# Patient Record
Sex: Male | Born: 1990
Health system: Southern US, Community
[De-identification: ages and names within clinical notes are randomized; demographics above are authoritative.]

## PROBLEM LIST (undated history)

## (undated) DIAGNOSIS — K219 Gastro-esophageal reflux disease without esophagitis: Secondary | ICD-10-CM

## (undated) DIAGNOSIS — F988 Other specified behavioral and emotional disorders with onset usually occurring in childhood and adolescence: Secondary | ICD-10-CM

## (undated) HISTORY — DX: Other specified behavioral and emotional disorders with onset usually occurring in childhood and adolescence: F98.8

## (undated) HISTORY — PX: TONSILLECTOMY AND ADENOIDECTOMY: SUR1326

---
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
## 2012-01-21 ENCOUNTER — Telehealth: Payer: Self-pay | Admitting: Internal Medicine

## 2012-01-22 NOTE — Telephone Encounter (Signed)
Written Rx for home nebulizer.  Mom will pick up.

## 2012-02-07 ENCOUNTER — Other Ambulatory Visit: Payer: 59 | Admitting: Internal Medicine

## 2012-02-12 ENCOUNTER — Encounter: Payer: 59 | Admitting: Internal Medicine

## 2012-03-21 ENCOUNTER — Ambulatory Visit (INDEPENDENT_AMBULATORY_CARE_PROVIDER_SITE_OTHER): Payer: 59 | Admitting: Internal Medicine

## 2012-03-21 ENCOUNTER — Encounter: Payer: Self-pay | Admitting: Internal Medicine

## 2012-03-21 VITALS — HR 88 | Temp 98.5°F | Wt 173.0 lb

## 2012-03-21 DIAGNOSIS — J9801 Acute bronchospasm: Secondary | ICD-10-CM

## 2012-03-21 DIAGNOSIS — J45909 Unspecified asthma, uncomplicated: Secondary | ICD-10-CM

## 2012-03-21 DIAGNOSIS — J069 Acute upper respiratory infection, unspecified: Secondary | ICD-10-CM

## 2012-03-21 DIAGNOSIS — J309 Allergic rhinitis, unspecified: Secondary | ICD-10-CM

## 2012-03-21 NOTE — Progress Notes (Signed)
  Subjective:    Patient ID: Casey Russell, male    DOB: Jan 26, 1991, 21 y.o.   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 Patient came down with URI couple of days ago. Has had some shortness of breath. Has home nebulizer for home use and he took a treatment at home before coming to the office. Pulse oximetry 98% on room air. Some cough. Some shortness of breath. Some sore throat. No fever or shaking chills   Review of Systems     Objective:   Physical Exam  HEENT exam: TMs are clear. Pharynx is slightly injected. Neck is supple without thyromegaly. Chest clear to auscultation without rales or significant wheezing. Pulse oximetry 98% on room air. This is after a nebulizer treatment at home. Skin is warm and dry.            Assessment & Plan:  URI acute  History of asthma  History of allergic rhinitis  Bronchospasm  Plan: Sterapred DS 10 mg 6 day dosepak. Biaxin 500 mg twice daily for 10 days. Hycodan 4 ounces 1 teaspoon by mouth Q8 hours when necessary cough

## 2012-03-21 NOTE — Patient Instructions (Addendum)
Take Biaxin 500 mg twice daily for 10 days. Take Sterapred DS 10 mg 6 day dosepak for 6 days. Take Hycodan sparingly for cough

## 2012-03-24 ENCOUNTER — Ambulatory Visit: Payer: 59 | Admitting: Internal Medicine

## 2012-03-25 ENCOUNTER — Encounter: Payer: 59 | Admitting: Internal Medicine

## 2012-04-25 ENCOUNTER — Other Ambulatory Visit: Payer: Self-pay | Admitting: Internal Medicine

## 2012-05-05 ENCOUNTER — Ambulatory Visit (INDEPENDENT_AMBULATORY_CARE_PROVIDER_SITE_OTHER): Payer: 59 | Admitting: Internal Medicine

## 2012-05-05 ENCOUNTER — Encounter: Payer: Self-pay | Admitting: Internal Medicine

## 2012-05-05 VITALS — BP 120/80 | HR 92 | Temp 97.8°F | Ht 68.25 in | Wt 172.0 lb

## 2012-05-05 DIAGNOSIS — Z0000 Encounter for general adult medical examination without abnormal findings: Secondary | ICD-10-CM

## 2012-05-05 LAB — POCT URINALYSIS DIPSTICK
Bilirubin, UA: NEGATIVE
Blood, UA: NEGATIVE
Glucose, UA: NEGATIVE
Leukocytes, UA: NEGATIVE
Nitrite, UA: NEGATIVE
Urobilinogen, UA: NEGATIVE

## 2012-05-06 LAB — CHOLESTEROL, TOTAL: Cholesterol: 179 mg/dL (ref 0–200)

## 2012-05-06 LAB — CBC WITH DIFFERENTIAL/PLATELET
Basophils Relative: 1 % (ref 0–1)
Eosinophils Absolute: 0.4 10*3/uL (ref 0.0–0.7)
Lymphs Abs: 2.2 10*3/uL (ref 0.7–4.0)
MCH: 32.9 pg (ref 26.0–34.0)
MCHC: 35.4 g/dL (ref 30.0–36.0)
Neutro Abs: 4.6 10*3/uL (ref 1.7–7.7)
Neutrophils Relative %: 59 % (ref 43–77)
Platelets: 247 10*3/uL (ref 150–400)
RBC: 4.95 MIL/uL (ref 4.22–5.81)

## 2012-06-01 ENCOUNTER — Encounter: Payer: Self-pay | Admitting: Internal Medicine

## 2012-06-01 NOTE — Progress Notes (Signed)
  Subjective:    Patient ID: Casey Russell, male    DOB: 1990/10/15, 22 y.o.   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 He has a history of asthma. History of allergy to wheat and peanuts. History of migraine headaches. In 2002 he had a fractured right wrist.  Received Tdap vaccine July 2010 and meningococcal vaccine July 2012. Had Pneumovax immunization 2002. History of tonsillectomy and adenoidectomy 1999.  He does not smoke.  Family history: Mother with history of asthma. History of diabetes mellitus in maternal great grandmother. Hypertension in maternal grandmother and maternal uncle. Stroke in maternal great-grandmother. Not much known about father's family history.  History of right shoulder grade 1 a.c. separation 2007 from football injury. History of back sprain from wrestling injury 2011.  He was a 6 lbs. 10 oz. product of vaginal delivery. Apgars were 9 and 9 respectively. He was a term infant. Received hepatitis B series. Mother had asthma attack during pregnancy and had to be hospitalized in May before his birth in October.  History of hives and urticaria.  History of attention deficit issues. Currently not on attention deficit medication.      Review of Systems  Constitutional: Negative.   HENT: Negative.   Eyes: Negative.   Respiratory: Negative.   Genitourinary: Negative.   Musculoskeletal: Negative.   Neurological: Negative.   Hematological: Negative.   Psychiatric/Behavioral: Negative.        Objective:   Physical Exam  Vitals reviewed. Constitutional: He is oriented to person, place, and time. He appears well-developed and well-nourished. No distress.  HENT:  Head: Normocephalic and atraumatic.  Right Ear: External ear normal.  Left Ear: External ear normal.  Mouth/Throat: Oropharynx is clear and moist. No oropharyngeal exudate.  Eyes: Conjunctivae normal  and EOM are normal. Pupils are equal, round, and reactive to light. Right eye exhibits no discharge. Left eye exhibits no discharge. No scleral icterus.  Neck: Normal range of motion. Neck supple. No JVD present. No thyromegaly present.  Cardiovascular: Normal rate, normal heart sounds and intact distal pulses.   No murmur heard. Pulmonary/Chest: Effort normal and breath sounds normal. He has no wheezes. He has no rales.  Abdominal: Soft. Bowel sounds are normal. He exhibits no distension and no mass. There is no tenderness. There is no rebound and no guarding.  Genitourinary: Penis normal.       No hernias to direct palpation  Musculoskeletal: Normal range of motion. He exhibits no edema.  Lymphadenopathy:    He has no cervical adenopathy.  Neurological: He is alert and oriented to person, place, and time. He displays normal reflexes. No cranial nerve deficit. Coordination normal.  Skin: Skin is warm and dry. No rash noted. He is not diaphoretic.  Psychiatric: He has a normal mood and affect. His behavior is normal. Judgment and thought content normal.          Assessment & Plan:  History of asthma-stable has albuterol on hand  History of hives and urticaria  History of allergic rhinitis  History of migraine headaches-related to food. Has responded to watching headache producing foods  History of attention deficit disorder-currently not on medication  Plan: Return in 1-2 years or when necessary

## 2012-06-01 NOTE — Patient Instructions (Addendum)
Use inhaler if needed for asthma. Return in 1-2 years or as needed

## 2013-04-02 ENCOUNTER — Ambulatory Visit (INDEPENDENT_AMBULATORY_CARE_PROVIDER_SITE_OTHER): Payer: 59 | Admitting: Internal Medicine

## 2013-04-02 ENCOUNTER — Encounter: Payer: Self-pay | Admitting: Internal Medicine

## 2013-04-02 VITALS — BP 118/76 | HR 68 | Temp 97.2°F | Ht 68.0 in | Wt 175.0 lb

## 2013-04-02 DIAGNOSIS — I889 Nonspecific lymphadenitis, unspecified: Secondary | ICD-10-CM

## 2013-04-02 DIAGNOSIS — Z23 Encounter for immunization: Secondary | ICD-10-CM

## 2013-05-29 ENCOUNTER — Other Ambulatory Visit: Payer: Self-pay | Admitting: Internal Medicine

## 2013-09-13 ENCOUNTER — Encounter: Payer: Self-pay | Admitting: Internal Medicine

## 2013-09-13 NOTE — Patient Instructions (Signed)
Take Augmentin 500 mg 3 times daily for 10 days. 

## 2013-09-13 NOTE — Progress Notes (Signed)
   Subjective:    Patient ID: Casey Russell, male    DOB: 09/11/1990, 23 y.o.   MRN: [id number]  HPI Complaining of pain in left upper anterior neck area. No significant sore throat. Has had some respiratory congestion recently. Worried about neck pain. He is a Ship broker at [employer].    Review of Systems     Objective:   Physical Exam  Neck is supple. Tender left anterior cervical node. Pharynx only slightly injected. TMs are clear. Chest clear.      Assessment & Plan:  Left cervical adenitis  Plan: Augmentin 500 mg 3 times daily for 10 days

## 2014-08-20 ENCOUNTER — Other Ambulatory Visit: Payer: Self-pay | Admitting: Internal Medicine

## 2015-07-12 ENCOUNTER — Encounter: Payer: Self-pay | Admitting: Internal Medicine

## 2015-07-12 ENCOUNTER — Ambulatory Visit (INDEPENDENT_AMBULATORY_CARE_PROVIDER_SITE_OTHER): Payer: 59 | Admitting: Internal Medicine

## 2015-07-12 VITALS — BP 136/82 | HR 103 | Temp 100.1°F | Ht 67.0 in | Wt 180.0 lb

## 2015-07-12 DIAGNOSIS — J111 Influenza due to unidentified influenza virus with other respiratory manifestations: Secondary | ICD-10-CM

## 2015-07-12 MED ORDER — AMOXICILLIN 500 MG PO CAPS: 500.0000 mg | ORAL_CAPSULE | Freq: Three times a day (TID) | ORAL | Status: DC

## 2015-07-12 MED ORDER — PROMETHAZINE HCL 25 MG PO TABS: 25.0000 mg | ORAL_TABLET | Freq: Three times a day (TID) | ORAL | Status: DC | PRN

## 2015-07-12 MED ORDER — OSELTAMIVIR PHOSPHATE 75 MG PO CAPS: 75.0000 mg | ORAL_CAPSULE | Freq: Two times a day (BID) | ORAL | Status: DC

## 2015-07-12 MED FILL — OSELTAMIVIR PHOS 75 MG CAP: 75 | 5 days supply | Qty: 10 | Fill #0

## 2015-07-12 MED FILL — PROMETHAZINE 25 MG TABLET: 25 | 7 days supply | Qty: 20 | Fill #0

## 2015-07-12 MED FILL — AMOXICILLIN 500 MG CAPSULE: 500 | 10 days supply | Qty: 30 | Fill #0

## 2015-07-12 NOTE — Progress Notes (Signed)
   Subjective:    Patient ID: Casey Russell, male    DOB: Mar 15, 1991, 25 y.o.   MRN: [id number]  HPI Onset Sunday night of fever, myalgias, headache, nausea and respiratory congestion. History of asthma. No wheezing or respiratory distress. Did not take flu vaccine. Has been around people at work that were ill. 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 class this morning because of illness. Unable to eat due to nausea.    Review of Systems     Objective:   Physical Exam  Skin warm and dry. Nodes none. Pharynx is clear. TMs are clear. Neck is supple. Chest clear to auscultation without rales or wheezing.      Assessment & Plan:  Influenza  Plan: Tamiflu 75 mg twice daily for 5 days. Phenergan 25 mg #20 one by mouth every 8 hours when necessary nausea. Aleve 2 tablets twice daily for headache and fever. Amoxicillin 500 mg 3 times daily x 10 days for respiratory infection. Note to be out of school until afebrile for 24 hours.

## 2015-07-12 NOTE — Patient Instructions (Signed)
Take Tamiflu 75 mg twice daily for 5 days. Out of school until afebrile for 24 hours. Same instructions for work. Phenergan 25 mg tablets 1 by mouth every 8 hours when necessary nausea. Amoxicillin 500 mg 3 times daily for 10 days. Aleve 2 tablets twice daily for fever and myalgias.

## 2015-09-07 ENCOUNTER — Other Ambulatory Visit: Payer: Self-pay

## 2015-09-07 MED ORDER — ALBUTEROL SULFATE HFA 108 (90 BASE) MCG/ACT IN AERS: 2.0000 | INHALATION_SPRAY | Freq: Four times a day (QID) | RESPIRATORY_TRACT | Status: DC

## 2015-09-07 MED FILL — VENTOLIN HFA 90 MCG INHALER: 108 (90 BAS | 16 days supply | Qty: 18 | Fill #0

## 2015-09-07 NOTE — Telephone Encounter (Signed)
Refill request received from pharmacy per patient request

## 2016-05-22 ENCOUNTER — Encounter: Payer: 59 | Admitting: Internal Medicine

## 2016-05-22 ENCOUNTER — Telehealth: Payer: Self-pay | Admitting: Internal Medicine

## 2016-05-22 ENCOUNTER — Encounter: Payer: Self-pay | Admitting: Internal Medicine

## 2016-05-22 NOTE — Telephone Encounter (Signed)
Called pt cell phone and left message re: missed  CPE appt today. Asked him to reschedule.

## 2016-07-16 ENCOUNTER — Ambulatory Visit (INDEPENDENT_AMBULATORY_CARE_PROVIDER_SITE_OTHER): Payer: 59 | Admitting: Internal Medicine

## 2016-07-16 ENCOUNTER — Encounter: Payer: Self-pay | Admitting: Internal Medicine

## 2016-07-16 ENCOUNTER — Other Ambulatory Visit: Payer: Self-pay | Admitting: Internal Medicine

## 2016-07-16 VITALS — BP 140/90 | HR 84 | Temp 97.0°F | Ht 67.5 in | Wt 167.0 lb

## 2016-07-16 DIAGNOSIS — R030 Elevated blood-pressure reading, without diagnosis of hypertension: Secondary | ICD-10-CM | POA: Diagnosis not present

## 2016-07-16 DIAGNOSIS — Z113 Encounter for screening for infections with a predominantly sexual mode of transmission: Secondary | ICD-10-CM | POA: Diagnosis not present

## 2016-07-16 DIAGNOSIS — R8290 Unspecified abnormal findings in urine: Secondary | ICD-10-CM | POA: Diagnosis not present

## 2016-07-16 DIAGNOSIS — Z0000 Encounter for general adult medical examination without abnormal findings: Secondary | ICD-10-CM | POA: Diagnosis not present

## 2016-07-16 DIAGNOSIS — R319 Hematuria, unspecified: Secondary | ICD-10-CM | POA: Diagnosis not present

## 2016-07-16 DIAGNOSIS — Z13220 Encounter for screening for lipoid disorders: Secondary | ICD-10-CM | POA: Diagnosis not present

## 2016-07-16 LAB — LIPID PANEL
CHOL/HDL RATIO: 2.9 ratio (ref <5.0)
Cholesterol: 194 mg/dL (ref <200)
HDL: 68 mg/dL (ref >40)
LDL CALC: 107 mg/dL — AB (ref <100)
Triglycerides: 96 mg/dL (ref <150)
VLDL: 19 mg/dL (ref <30)

## 2016-07-16 LAB — COMPREHENSIVE METABOLIC PANEL
ALT: 17 U/L (ref 9–46)
AST: 18 U/L (ref 10–40)
Albumin: 4.3 g/dL (ref 3.6–5.1)
Alkaline Phosphatase: 72 U/L (ref 40–115)
BUN: 11 mg/dL (ref 7–25)
CO2: 27 mmol/L (ref 20–31)
Calcium: 9.6 mg/dL (ref 8.6–10.3)
Chloride: 105 mmol/L (ref 98–110)
Creat: 1.02 mg/dL (ref 0.60–1.35)
GLUCOSE: 78 mg/dL (ref 65–99)
POTASSIUM: 3.8 mmol/L (ref 3.5–5.3)
Sodium: 140 mmol/L (ref 135–146)
Total Bilirubin: 0.8 mg/dL (ref 0.2–1.2)
Total Protein: 6.7 g/dL (ref 6.1–8.1)

## 2016-07-16 LAB — POCT URINALYSIS DIPSTICK
Bilirubin, UA: NEGATIVE
Glucose, UA: NEGATIVE
Ketones, UA: NEGATIVE
Leukocytes, UA: NEGATIVE
Nitrite, UA: NEGATIVE
PH UA: 6 (ref 5.0–8.0)
SPEC GRAV UA: 1.015 (ref 1.030–1.035)
UROBILINOGEN UA: 0.2 (ref ?–2.0)

## 2016-07-16 LAB — CBC WITH DIFFERENTIAL/PLATELET
BASOS ABS: 81 {cells}/uL (ref 0–200)
BASOS PCT: 1 %
EOS ABS: 405 {cells}/uL (ref 15–500)
Eosinophils Relative: 5 %
HCT: 47.3 % (ref 38.5–50.0)
Hemoglobin: 16.3 g/dL (ref 13.2–17.1)
LYMPHS PCT: 29 %
Lymphs Abs: 2349 cells/uL (ref 850–3900)
MCH: 33.1 pg — AB (ref 27.0–33.0)
MCHC: 34.5 g/dL (ref 32.0–36.0)
MCV: 96.1 fL (ref 80.0–100.0)
MONOS PCT: 6 %
MPV: 9.6 fL (ref 7.5–12.5)
Monocytes Absolute: 486 cells/uL (ref 200–950)
Neutro Abs: 4779 cells/uL (ref 1500–7800)
Neutrophils Relative %: 59 %
PLATELETS: 209 10*3/uL (ref 140–400)
RBC: 4.92 MIL/uL (ref 4.20–5.80)
RDW: 13.3 % (ref 11.0–15.0)
WBC: 8.1 10*3/uL (ref 3.8–10.8)

## 2016-07-16 NOTE — Addendum Note (Signed)
Addended by: Inocencio Homes on: 07/16/2016 12:12 PM   Modules accepted: Orders

## 2016-07-16 NOTE — Progress Notes (Addendum)
Subjective:    Patient ID: [name], male    DOB: 1990-12-19, 26 y.o.   MRN: [id number]  HPI  26 year old Male for health maintenance exam and evaluation of medical issues. 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 He says he missed class this morning. Says he overslept. His blood pressure for some reason is elevated at 140 overnight knee. It was rechecked and was once again 140/90. Says he's been eating a lot of fast food. Told him he needs to cut back on fast food and eat more healthy foods. He works out some. He is in good physical shape. He is not overweight.  Has no complaints. Feels well in general.  He has a remote history of asthma but has not had any issues with asthma in several years.  In 2013 he was seen by Dr. Scarlette Shorts for an episode of acute colitis. This was noted on CT scan. Was treated with Cipro and Flagyl. Symptoms improved. He also had an episode of erosive esophagitis January 2013 noted on endoscopy by Dr. Henrene Pastor. 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 He had esophageal dilatation. Was treated with omeprazole.  Tetanus immunization is up-to-date. He had meningococcal vaccine July 2012.  History of tonsillectomy and adenoidectomy 1999.  History of allergy to wheat and peanuts.  In 2002 he had a fractured right wrist.  He does not smoke.  Family history: Mother with history of asthma. History of diabetes mellitus and maternal great-grandmother. Hypertension in maternal grandmother and maternal uncle. Stroke in maternal great-grandmother. Not much known about father's family history.  Treated for influenza March 2017.  Did not take flu vaccine this season.  History of right shoulder grade 1 AC joint separation 2007 from football injury. History of back sprain from wrestling injury 2011  He was a 6 lbs. 10 oz. product of a vaginal delivery. Apgars were 9 and 9 respectively. He was  a term infant. Received the hepatitis B series. Mother had asthma attack during pregnancy and had to be hospitalized in May before his birth in October.  History of attention deficit issues. Currently not on attention deficit medication.  History of migraine headaches.  Review of Systems  Constitutional: Negative.   All other systems reviewed and are negative.      Objective:   Physical Exam  Constitutional: He is oriented to person, place, and time. He appears well-developed and well-nourished. No distress.  HENT:  Head: Normocephalic and atraumatic.  Right Ear: External ear normal.  Left Ear: External ear normal.  Mouth/Throat: Oropharynx is clear and moist. No oropharyngeal exudate.  Eyes: Conjunctivae and EOM are normal. Pupils are equal, round, and reactive to light. Right eye exhibits no discharge. Left eye exhibits no discharge. No scleral icterus.  Neck: Neck supple. No JVD present. No thyromegaly present.  Cardiovascular: Normal rate, regular rhythm, normal heart sounds and intact distal pulses.   No murmur heard. Pulmonary/Chest: Effort normal and breath sounds normal. No respiratory distress. He has no wheezes. He has no rales.  Abdominal: Soft. Bowel sounds are normal. He exhibits no distension and no mass. There is no tenderness. There is no rebound and no guarding.  Genitourinary:  Genitourinary Comments: No hernias to direct palpation  Musculoskeletal: He exhibits no edema.  Lymphadenopathy:    He has no cervical adenopathy.  Neurological: He is alert and oriented to person, place, and time. He has normal reflexes. No cranial  nerve deficit. Coordination normal.  Skin: Skin is warm and dry. No rash noted. He is not diaphoretic.  Psychiatric: He has a normal mood and affect. His behavior is normal. Judgment and thought content normal.  Vitals reviewed.         Assessment & Plan:  Normal health maintenance exam  Elevated blood pressure reading  Plan: Cut back  on fast food and return in May after school is out for blood pressure check and office visit. STD screening done today at his request. Fasting labs drawn and are pending. Immunizations are up-to-date.

## 2016-07-16 NOTE — Patient Instructions (Addendum)
Return in May for blood pressure check. Watch fast food. Labs pending.

## 2016-07-17 ENCOUNTER — Ambulatory Visit (INDEPENDENT_AMBULATORY_CARE_PROVIDER_SITE_OTHER): Payer: 59 | Admitting: Internal Medicine

## 2016-07-17 ENCOUNTER — Encounter: Payer: Self-pay | Admitting: Internal Medicine

## 2016-07-17 VITALS — BP 168/100 | HR 66 | Temp 97.4°F | Ht 67.5 in | Wt 165.0 lb

## 2016-07-17 DIAGNOSIS — A749 Chlamydial infection, unspecified: Secondary | ICD-10-CM | POA: Diagnosis not present

## 2016-07-17 LAB — URINALYSIS, MICROSCOPIC ONLY
Bacteria, UA: NONE SEEN [HPF]
Casts: NONE SEEN [LPF]
Crystals: NONE SEEN [HPF]
Yeast: NONE SEEN [HPF]

## 2016-07-17 LAB — GC/CHLAMYDIA PROBE AMP
CT PROBE, AMP APTIMA: DETECTED — AB
GC PROBE AMP APTIMA: NOT DETECTED

## 2016-07-17 LAB — HIV ANTIBODY (ROUTINE TESTING W REFLEX): HIV: NONREACTIVE

## 2016-07-17 MED ORDER — DOXYCYCLINE HYCLATE 100 MG PO TABS: 100.0000 mg | ORAL_TABLET | Freq: Two times a day (BID) | ORAL | 0 refills | Status: DC

## 2016-07-17 MED FILL — DOXYCYCLINE HYCLATE 100 MG: 100 | 10 days supply | Qty: 20 | Fill #0

## 2016-07-17 NOTE — Patient Instructions (Signed)
Doxycycline 100 mg twice daily for 10 days. No intercourse until rechecked in 2 weeks. Patient to notify partner.

## 2016-07-17 NOTE — Progress Notes (Signed)
   Subjective:    Patient ID: Casey Russell, male    DOB: [birth year], 26 y.o.   MRN: [id number]  HPI 26 year old 26 male here yesterday for physical exam. He wanted to be screened for STDs. HIV is negative but he had a positive chlamydia probe by urine testing. He said he felt something was wrong because he had some discomfort in his genital area. He has not noted any penile discharge. His dipstick UA was abnormal it showed moderate blood and trace protein. It was sent for microscopic and he had greater than 60 white blood cells per high-powered field and 3-10 red blood cells per high-powered field. Urine culture is pending.  I reviewed other lab results with him today. LDL cholesterol is 107.  His blood pressures elevated once again today but he is anxious about these results.  He says he was anxious yesterday about coming to the doctor.     Review of Systems see above     Objective:   Physical Exam  Not examined periods but 15 minutes speaking with him about chlamydia and course of treatment and follow-up treatment. Needs to notify partner of findings.      Assessment & Plan:  Chlamydia  Plan: Doxycycline 100 mg twice daily for 10 days. No intercourse until rechecked in 2 weeks. He is also scheduled for follow-up blood pressure check at that time as well as in May. Seems to have office hypertension due to anxiety.

## 2016-07-18 LAB — URINE CULTURE: Organism ID, Bacteria: NO GROWTH

## 2016-07-31 ENCOUNTER — Ambulatory Visit: Payer: 59 | Admitting: Internal Medicine

## 2016-08-01 ENCOUNTER — Ambulatory Visit: Payer: 59 | Admitting: Internal Medicine

## 2016-08-02 ENCOUNTER — Ambulatory Visit (INDEPENDENT_AMBULATORY_CARE_PROVIDER_SITE_OTHER): Payer: 59 | Admitting: Internal Medicine

## 2016-08-02 VITALS — BP 110/88 | HR 82 | Temp 97.4°F | Wt 169.0 lb

## 2016-08-02 DIAGNOSIS — A749 Chlamydial infection, unspecified: Secondary | ICD-10-CM | POA: Diagnosis not present

## 2016-08-02 DIAGNOSIS — R8290 Unspecified abnormal findings in urine: Secondary | ICD-10-CM | POA: Diagnosis not present

## 2016-08-02 DIAGNOSIS — Z8619 Personal history of other infectious and parasitic diseases: Secondary | ICD-10-CM

## 2016-08-02 DIAGNOSIS — R030 Elevated blood-pressure reading, without diagnosis of hypertension: Secondary | ICD-10-CM

## 2016-08-02 LAB — POCT URINALYSIS DIPSTICK
Bilirubin, UA: NEGATIVE
Glucose, UA: NEGATIVE
KETONES UA: NEGATIVE
Leukocytes, UA: NEGATIVE
Nitrite, UA: NEGATIVE
PROTEIN UA: NEGATIVE
SPEC GRAV UA: 1.015 (ref 1.030–1.035)
Urobilinogen, UA: NEGATIVE (ref ?–2.0)
pH, UA: 6 (ref 5.0–8.0)

## 2016-08-03 LAB — GC/CHLAMYDIA PROBE AMP
CT PROBE, AMP APTIMA: NOT DETECTED
GC PROBE AMP APTIMA: NOT DETECTED

## 2016-08-25 ENCOUNTER — Encounter: Payer: Self-pay | Admitting: Internal Medicine

## 2016-08-25 NOTE — Patient Instructions (Signed)
Chlamydia test of cure is negative. Continue to monitor blood pressure. Watch salt and fat in diet. Try to get regular exercise.

## 2016-08-25 NOTE — Progress Notes (Signed)
   Subjective:    Patient ID: Casey Russell, male    DOB: [birth year], 26 y.o.   MRN: [id number]  HPI he was here March 19 for physical exam. He was screened for STDs and was found to have a positive chlamydia probe by urine test. He had felt some discomfort in his general tall area. He did have an abnormal urine dipstick at the time. He did take doxycycline as directed and he's now here for test of cure and follow-up on elevated blood pressure. When he was here on March 20 blood pressure was 168/100 but he was anxious. Blood pressure was 140/90 on the day of his physical exam. Blood pressure today is 110/88.    Review of Systems see above     Objective:   Physical Exam  He is not overweight. Is much Colmer than he was at visit of March 20. His dipstick urine is now normal status post treatment with doxycycline and his chlamydia test of cure is negative.     Assessment & Plan:  Elevated blood pressure-needs to be monitored  Plan: Watch diet. Watch salt intake. Advised low-fat diet and exercise. He plans to go to summer school this summer. 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 Spoke with him about sexually transmitted diseases.

## 2016-10-29 ENCOUNTER — Encounter (HOSPITAL_COMMUNITY): Payer: Self-pay | Admitting: Family Medicine

## 2016-10-29 ENCOUNTER — Ambulatory Visit (HOSPITAL_COMMUNITY)
Admission: EM | Admit: 2016-10-29 | Discharge: 2016-10-29 | Disposition: A | Discharge status: Home / Self Care | Payer: 59 | Attending: Emergency Medicine | Admitting: Emergency Medicine

## 2016-10-29 ENCOUNTER — Ambulatory Visit (INDEPENDENT_AMBULATORY_CARE_PROVIDER_SITE_OTHER): Payer: 59

## 2016-10-29 DIAGNOSIS — Z23 Encounter for immunization: Secondary | ICD-10-CM

## 2016-10-29 DIAGNOSIS — S61052A Open bite of left thumb without damage to nail, initial encounter: Secondary | ICD-10-CM | POA: Diagnosis not present

## 2016-10-29 DIAGNOSIS — W503XXA Accidental bite by another person, initial encounter: Secondary | ICD-10-CM

## 2016-10-29 DIAGNOSIS — S61259A Open bite of unspecified finger without damage to nail, initial encounter: Secondary | ICD-10-CM

## 2016-10-29 DIAGNOSIS — M7989 Other specified soft tissue disorders: Secondary | ICD-10-CM | POA: Diagnosis not present

## 2016-10-29 DIAGNOSIS — S61251A Open bite of left index finger without damage to nail, initial encounter: Secondary | ICD-10-CM

## 2016-10-29 MED ORDER — MUPIROCIN CALCIUM 2 % EX CREA: 1.0000 "application " | TOPICAL_CREAM | Freq: Two times a day (BID) | CUTANEOUS | 0 refills | Status: AC

## 2016-10-29 MED ORDER — TETANUS-DIPHTH-ACELL PERTUSSIS 5-2.5-18.5 LF-MCG/0.5 IM SUSP
INTRAMUSCULAR | Status: AC
  Filled 2016-10-29: qty 0.5

## 2016-10-29 MED ORDER — AMOXICILLIN-POT CLAVULANATE 875-125 MG PO TABS: 1.0000 | ORAL_TABLET | Freq: Two times a day (BID) | ORAL | 0 refills | Status: AC

## 2016-10-29 MED ORDER — TETANUS-DIPHTH-ACELL PERTUSSIS 5-2.5-18.5 LF-MCG/0.5 IM SUSP
0.5000 mL | Freq: Once | INTRAMUSCULAR | Status: AC
  Administered 2016-10-29: 0.5 mL via INTRAMUSCULAR

## 2016-10-29 MED ORDER — BACITRACIN-NEOMYCIN-POLYMYXIN 400-5-5000 EX OINT
TOPICAL_OINTMENT | Freq: Once | CUTANEOUS | Status: AC
  Administered 2016-10-29: 13:00:00 via TOPICAL

## 2016-10-29 MED FILL — MUPIROCIN 2% CREAM: 2 | 10 days supply | Qty: 30 | Fill #0

## 2016-10-29 MED FILL — AMOX-CLAV 875-125 MG TABLET: 875-125 | 7 days supply | Qty: 14 | Fill #0

## 2016-10-29 NOTE — ED Notes (Signed)
Pharmacy called and stated that the  Bactroban needed to be changed to ointment instead of cream. J. Defelice NP gave a verbal order to change.

## 2016-10-29 NOTE — ED Triage Notes (Signed)
Pt here for bite wound and swelling to left index finger. sts he was bit on Saturday. Unable to bend finger at the joint.

## 2016-10-29 NOTE — Discharge Instructions (Signed)
Rest,elevate, left hand daily dressing changes to left hand(wound). Take antibiotic as directed. Your hand xray was negative for gas, fracture or dislocation. Follow up with PCP in 2 days for wound check. Go to ER if new or worsening s/s(fever, unable to keep meds down, worsening pain, decreased sensation/movement of hand, streaking, etc).
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
## 2016-12-08 DIAGNOSIS — Y92410 Unspecified street and highway as the place of occurrence of the external cause: Secondary | ICD-10-CM | POA: Diagnosis not present

## 2016-12-08 DIAGNOSIS — S1980XA Other specified injuries of unspecified part of neck, initial encounter: Secondary | ICD-10-CM | POA: Diagnosis not present

## 2016-12-08 DIAGNOSIS — S0990XA Unspecified injury of head, initial encounter: Secondary | ICD-10-CM | POA: Diagnosis not present

## 2016-12-08 DIAGNOSIS — S298XXA Other specified injuries of thorax, initial encounter: Secondary | ICD-10-CM | POA: Diagnosis not present

## 2016-12-08 DIAGNOSIS — S3991XA Unspecified injury of abdomen, initial encounter: Secondary | ICD-10-CM | POA: Diagnosis not present

## 2016-12-10 ENCOUNTER — Encounter: Payer: Self-pay | Admitting: Internal Medicine

## 2016-12-10 ENCOUNTER — Telehealth: Payer: Self-pay | Admitting: Internal Medicine

## 2016-12-10 ENCOUNTER — Ambulatory Visit
Admission: RE | Admit: 2016-12-10 | Discharge: 2016-12-10 | Disposition: A | Discharge status: Home / Self Care | Payer: 59 | Source: Ambulatory Visit | Attending: Internal Medicine | Admitting: Internal Medicine

## 2016-12-10 ENCOUNTER — Ambulatory Visit (INDEPENDENT_AMBULATORY_CARE_PROVIDER_SITE_OTHER): Payer: 59 | Admitting: Internal Medicine

## 2016-12-10 DIAGNOSIS — M542 Cervicalgia: Secondary | ICD-10-CM | POA: Diagnosis not present

## 2016-12-10 DIAGNOSIS — M25562 Pain in left knee: Secondary | ICD-10-CM

## 2016-12-10 DIAGNOSIS — M25511 Pain in right shoulder: Secondary | ICD-10-CM

## 2016-12-10 DIAGNOSIS — G44319 Acute post-traumatic headache, not intractable: Secondary | ICD-10-CM | POA: Diagnosis not present

## 2016-12-10 DIAGNOSIS — S4991XA Unspecified injury of right shoulder and upper arm, initial encounter: Secondary | ICD-10-CM | POA: Diagnosis not present

## 2016-12-10 MED ORDER — CYCLOBENZAPRINE HCL 10 MG PO TABS: 10.0000 mg | ORAL_TABLET | Freq: Every day | ORAL | 0 refills | Status: AC

## 2016-12-10 MED FILL — CYCLOBENZAPRINE 10 MG TAB: 10 | 30 days supply | Qty: 30 | Fill #0

## 2016-12-10 MED FILL — OXYCODONE W/APAP 5/325 TAB: 5-325 | 3 days supply | Qty: 15 | Fill #0
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
## 2016-12-10 NOTE — Patient Instructions (Addendum)
Rest and drink plenty of fluids. Take over-the-counter anti-inflammatory medication. Take narcotic pain medication sparingly. May take Flexeril at bedtime. Out of work for 2 weeks. May attend class. See orthopedist tomorrow.
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
## 2016-12-11 DIAGNOSIS — S40011A Contusion of right shoulder, initial encounter: Secondary | ICD-10-CM | POA: Diagnosis not present

## 2016-12-11 MED FILL — MELOXICAM 15 MG TABLET: 15 | 30 days supply | Qty: 30 | Fill #0

## 2016-12-25 ENCOUNTER — Encounter: Payer: Self-pay | Admitting: Internal Medicine

## 2016-12-25 ENCOUNTER — Ambulatory Visit (INDEPENDENT_AMBULATORY_CARE_PROVIDER_SITE_OTHER): Payer: 59 | Admitting: Internal Medicine

## 2016-12-25 VITALS — BP 138/80 | HR 87 | Temp 98.3°F | Wt 174.0 lb

## 2016-12-25 DIAGNOSIS — M25511 Pain in right shoulder: Secondary | ICD-10-CM

## 2016-12-25 DIAGNOSIS — Z23 Encounter for immunization: Secondary | ICD-10-CM

## 2016-12-25 DIAGNOSIS — S40011D Contusion of right shoulder, subsequent encounter: Secondary | ICD-10-CM | POA: Diagnosis not present
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
## 2016-12-25 NOTE — Patient Instructions (Signed)
Note to be out of work for 4 weeks. Continue meloxicam and when necessary Flexeril. Flu vaccine given today.

## 2017-01-08 DIAGNOSIS — S40011D Contusion of right shoulder, subsequent encounter: Secondary | ICD-10-CM | POA: Diagnosis not present

## 2017-01-10 MED FILL — IBUPROFEN 600 MG TABLET: 600 | 3 days supply | Qty: 12 | Fill #0
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
## 2018-07-20 DIAGNOSIS — S1015XA Superficial foreign body of throat, initial encounter: Secondary | ICD-10-CM | POA: Diagnosis not present

## 2019-06-29 DIAGNOSIS — Z20828 Contact with and (suspected) exposure to other viral communicable diseases: Secondary | ICD-10-CM | POA: Diagnosis not present

## 2020-04-21 ENCOUNTER — Telehealth: Payer: Self-pay | Admitting: Internal Medicine

## 2020-04-21 MED ORDER — ALBUTEROL SULFATE HFA 108 (90 BASE) MCG/ACT IN AERS: 2.0000 | INHALATION_SPRAY | Freq: Four times a day (QID) | RESPIRATORY_TRACT | 11 refills | Status: AC

## 2020-04-21 NOTE — Telephone Encounter (Signed)
DONE

## 2020-04-21 NOTE — Telephone Encounter (Signed)
Erasto Sleight [phone]  Cameran called he is now living in [place], [place] and would like for you to send in an Albuterol Inhaler to below pharmacy.  [address] [place], [place] [address]

## 2020-04-21 NOTE — Telephone Encounter (Signed)
Please send this in for one year.
